# Patient Record
Sex: Female | Born: 1975 | Race: White | Hispanic: No | Marital: Married | Smoking: Never smoker
Health system: Southern US, Community
[De-identification: ages and names within clinical notes are randomized; demographics above are authoritative.]

## PROBLEM LIST (undated history)

## (undated) HISTORY — PX: NO PAST SURGERIES: SHX2092

---

## 2016-05-01 ENCOUNTER — Encounter: Payer: Self-pay | Admitting: Emergency Medicine

## 2016-05-01 ENCOUNTER — Emergency Department (INDEPENDENT_AMBULATORY_CARE_PROVIDER_SITE_OTHER): Payer: BLUE CROSS/BLUE SHIELD

## 2016-05-01 ENCOUNTER — Emergency Department (INDEPENDENT_AMBULATORY_CARE_PROVIDER_SITE_OTHER)
Admission: EM | Admit: 2016-05-01 | Discharge: 2016-05-01 | Disposition: A | Discharge status: Home / Self Care | Payer: BLUE CROSS/BLUE SHIELD | Source: Home / Self Care | Attending: Family Medicine | Admitting: Family Medicine

## 2016-05-01 DIAGNOSIS — R05 Cough: Secondary | ICD-10-CM | POA: Diagnosis not present

## 2016-05-01 DIAGNOSIS — J101 Influenza due to other identified influenza virus with other respiratory manifestations: Secondary | ICD-10-CM

## 2016-05-01 DIAGNOSIS — R079 Chest pain, unspecified: Secondary | ICD-10-CM

## 2016-05-01 DIAGNOSIS — R112 Nausea with vomiting, unspecified: Secondary | ICD-10-CM

## 2016-05-01 DIAGNOSIS — R0602 Shortness of breath: Secondary | ICD-10-CM | POA: Diagnosis not present

## 2016-05-01 MED ORDER — KETOROLAC TROMETHAMINE 30 MG/ML IJ SOLN
30.0000 mg | Freq: Once | INTRAMUSCULAR | Status: AC
  Administered 2016-05-01: 30 mg via INTRAMUSCULAR

## 2016-05-01 MED ORDER — ONDANSETRON 4 MG PO TBDP: ORAL_TABLET | ORAL | 0 refills | Status: DC

## 2016-05-01 MED ORDER — ONDANSETRON 4 MG PO TBDP: 4.0000 mg | ORAL_TABLET | Freq: Once | ORAL | Status: DC

## 2016-05-01 MED ORDER — GUAIFENESIN-CODEINE 100-10 MG/5ML PO SOLN: ORAL | 0 refills | Status: DC

## 2016-05-01 MED ORDER — OSELTAMIVIR PHOSPHATE 75 MG PO CAPS: 75.0000 mg | ORAL_CAPSULE | Freq: Two times a day (BID) | ORAL | 0 refills | Status: DC

## 2016-05-01 NOTE — ED Provider Notes (Signed)
[facility]    CSN: [id number] Arrival date & time: 05/01/16  1226     History   Chief Complaint Chief Complaint  Patient presents with  . Headache    HPI Lauren Pope is a 41 y.o. female.   Patient and [place] from [place] one week ago.  Two days ago she felt fatigued, and yesterday she developed some sinus congestion and scratchy throat.  Today she developed severe frontal headache, myalgias, chills, and non-productive cough.  No history of asthma. She reports that she took Ibuprofen four hours ago.      History reviewed. No pertinent past medical history.  There are no active problems to display for this patient.   History reviewed. No pertinent surgical history.  OB History    No data available       Home Medications    Prior to Admission medications   Medication Sig Start Date End Date Taking? Authorizing Provider  guaiFENesin-codeine 100-10 MG/5ML syrup Take 10mL by mouth at bedtime as needed for cough 05/01/16   Lattie HawStephen A Garon Melander, MD  ondansetron (ZOFRAN ODT) 4 MG disintegrating tablet Take one tab by mouth Q6hr prn nausea.  Dissolve under tongue. 05/01/16   Lattie HawStephen A Jaidan Prevette, MD  oseltamivir (TAMIFLU) 75 MG capsule Take 1 capsule (75 mg total) by mouth every 12 (twelve) hours. 05/01/16   Lattie HawStephen A Sheliah Fiorillo, MD    Family History No family history on file.  Social History Social History  Substance Use Topics  . Smoking status: Never Smoker  . Smokeless tobacco: Never Used  . Alcohol use Yes     Allergies   Patient has no known allergies.   Review of Systems Review of Systems + sore throat + cough No pleuritic pain, but feels tight in anterior chest. No wheezing + nasal congestion + post-nasal drainage No sinus pain/pressure No itchy/red eyes No earache No hemoptysis + SOB No fever, + chills/sweats + nausea + vomiting No abdominal pain No diarrhea No urinary symptoms No skin rash + fatigue + myalgias +  headache Used OTC meds without relief   Physical Exam Triage Vital Signs ED Triage Vitals [05/01/16 1331]  Enc Vitals Group     BP 107/70     Pulse Rate 99     Resp      Temp 98.3 F (36.8 C)     Temp Source Oral     SpO2 98 %     Weight 106 lb 8 oz (48.3 kg)     Height 5\' 5"  (1.651 m)     Head Circumference      Peak Flow      Pain Score      Pain Loc      Pain Edu?      Excl. in GC?    No data found.   Updated Vital Signs BP 107/70 (BP Location: Left Arm)   Pulse 99   Temp 98.3 F (36.8 C) (Oral)   Ht 5\' 5"  (1.651 m)   Wt 106 lb 8 oz (48.3 kg)   LMP 04/24/2016 (Approximate)   SpO2 98%   BMI 17.72 kg/m   Visual Acuity Right Eye Distance:   Left Eye Distance:   Bilateral Distance:    Right Eye Near:   Left Eye Near:    Bilateral Near:     Physical Exam Nursing notes and Vital Signs reviewed. Appearance:  Patient appears stated age.  She appears uncomfortable but in no acute distress Eyes:  Pupils are equal, round, and reactive to light and accomodation.  Extraocular movement is intact.  Conjunctivae are not inflamed  Ears:  Canals normal.  Tympanic membranes normal.  Nose:  Congested turbinates.  No sinus tenderness.    Pharynx:  Normal Neck:  Supple.  Non-tender mildly enlarged posterior/lateral nodes are palpated bilaterally  Lungs:  Clear to auscultation.  Breath sounds are equal.  Moving air well. Heart:  Regular rate and rhythm without murmurs, rubs, or gallops.  Abdomen:  Nontender without masses or hepatosplenomegaly.  Bowel sounds are present.  No CVA or flank tenderness.  Extremities:  No edema.  Skin:  No rash present.    UC Treatments / Results  Labs (all labs ordered are listed, but only abnormal results are displayed) Labs Reviewed  POCT INFLUENZA A/B:  Positive B       EKG  EKG Interpretation None       Radiology Dg Chest 2 View  Result Date: 05/01/2016 CLINICAL DATA:  Cough and shortness of breath. Central chest pain.  Myalgias. Emesis. Headache. EXAM: CHEST  2 VIEW COMPARISON:  None. FINDINGS: The heart size and mediastinal contours are within normal limits. Both lungs are clear. The visualized skeletal structures are unremarkable. IMPRESSION: Normal examination. Electronically Signed   By: Beckie Salts M.D.   On: 05/01/2016 14:13    Procedures Procedures (including critical care time)  Medications Ordered in UC Medications  ondansetron (ZOFRAN-ODT) disintegrating tablet 4 mg (not administered)  ketorolac (TORADOL) 30 MG/ML injection 30 mg (30 mg Intramuscular Given 05/01/16 1404)     Initial Impression / Assessment and Plan / UC Course  I have reviewed the triage vital signs and the nursing notes.  Pertinent labs & imaging results that were available during my care of the patient were reviewed by me and considered in my medical decision making (see chart for details).    Administered Zofran ODT 4mg  PO.  Given RX for same.  Administered Toradol 30mg  IM Begin Tamiflu. Rx for Robitussin AC for night time cough.   Take plain guaifenesin (1200mg  extended release tabs such as Mucinex) twice daily, with plenty of water, for cough and congestion.  May add Pseudoephedrine (30mg , one or two every 4 to 6 hours) for sinus congestion.  Get adequate rest.   Try warm salt water gargles for sore throat.  Stop all antihistamines for now, and other non-prescription cough/cold preparations. May take Ibuprofen 200mg , 4 tabs every 8 hours with food for body aches, headache, etc. Rx prophylactic Tamiflu for three family memmers. Followup with Family Doctor if not improved in five days.    Final Clinical Impressions(s) / UC Diagnoses   Final diagnoses:  Influenza B  Nausea and vomiting in adult    New Prescriptions New Prescriptions   GUAIFENESIN-CODEINE 100-10 MG/5ML SYRUP    Take 10mL by mouth at bedtime as needed for cough   ONDANSETRON (ZOFRAN ODT) 4 MG DISINTEGRATING TABLET    Take one tab by mouth Q6hr prn  nausea.  Dissolve under tongue.   OSELTAMIVIR (TAMIFLU) 75 MG CAPSULE    Take 1 capsule (75 mg total) by mouth every 12 (twelve) hours.     Lattie Haw, MD 05/01/16 [phone]

## 2016-05-01 NOTE — ED Triage Notes (Signed)
Pt c/o a massive HA today, no relief w/Tylenol, vomitting, croupy cough, trouble breathing, body aches.

## 2016-05-01 NOTE — Discharge Instructions (Signed)
Take plain guaifenesin (1200mg extended release tabs such as Mucinex) twice daily, with plenty of water, for cough and congestion.  May add Pseudoephedrine (30mg, one or two every 4 to 6 hours) for sinus congestion.  Get adequate rest.   °Try warm salt water gargles for sore throat.  °Stop all antihistamines for now, and other non-prescription cough/cold preparations. °May take Ibuprofen 200mg, 4 tabs every 8 hours with food for body aches, headache, etc. °  °

## 2016-05-02 ENCOUNTER — Telehealth: Payer: Self-pay | Admitting: Emergency Medicine

## 2016-05-02 LAB — POC INFLUENZA A&B (BINAX/QUICKVUE)
INFLUENZA A, POC: NEGATIVE
Influenza B, POC: POSITIVE — AB

## 2016-05-02 NOTE — Telephone Encounter (Signed)
Encounter created to add order.

## 2016-05-03 ENCOUNTER — Telehealth: Payer: Self-pay

## 2016-05-03 NOTE — Telephone Encounter (Signed)
Left message for patient to call UC or PCP if questions or concerns,

## 2017-10-20 IMAGING — DX DG CHEST 2V
2 series · 2 of 2 positions shown · non-contrast
Comparison: None.

CLINICAL DATA: Cough and shortness of breath. Central chest pain.
Myalgias. Emesis. Headache.

EXAM:
CHEST  2 VIEW

[chest pa]
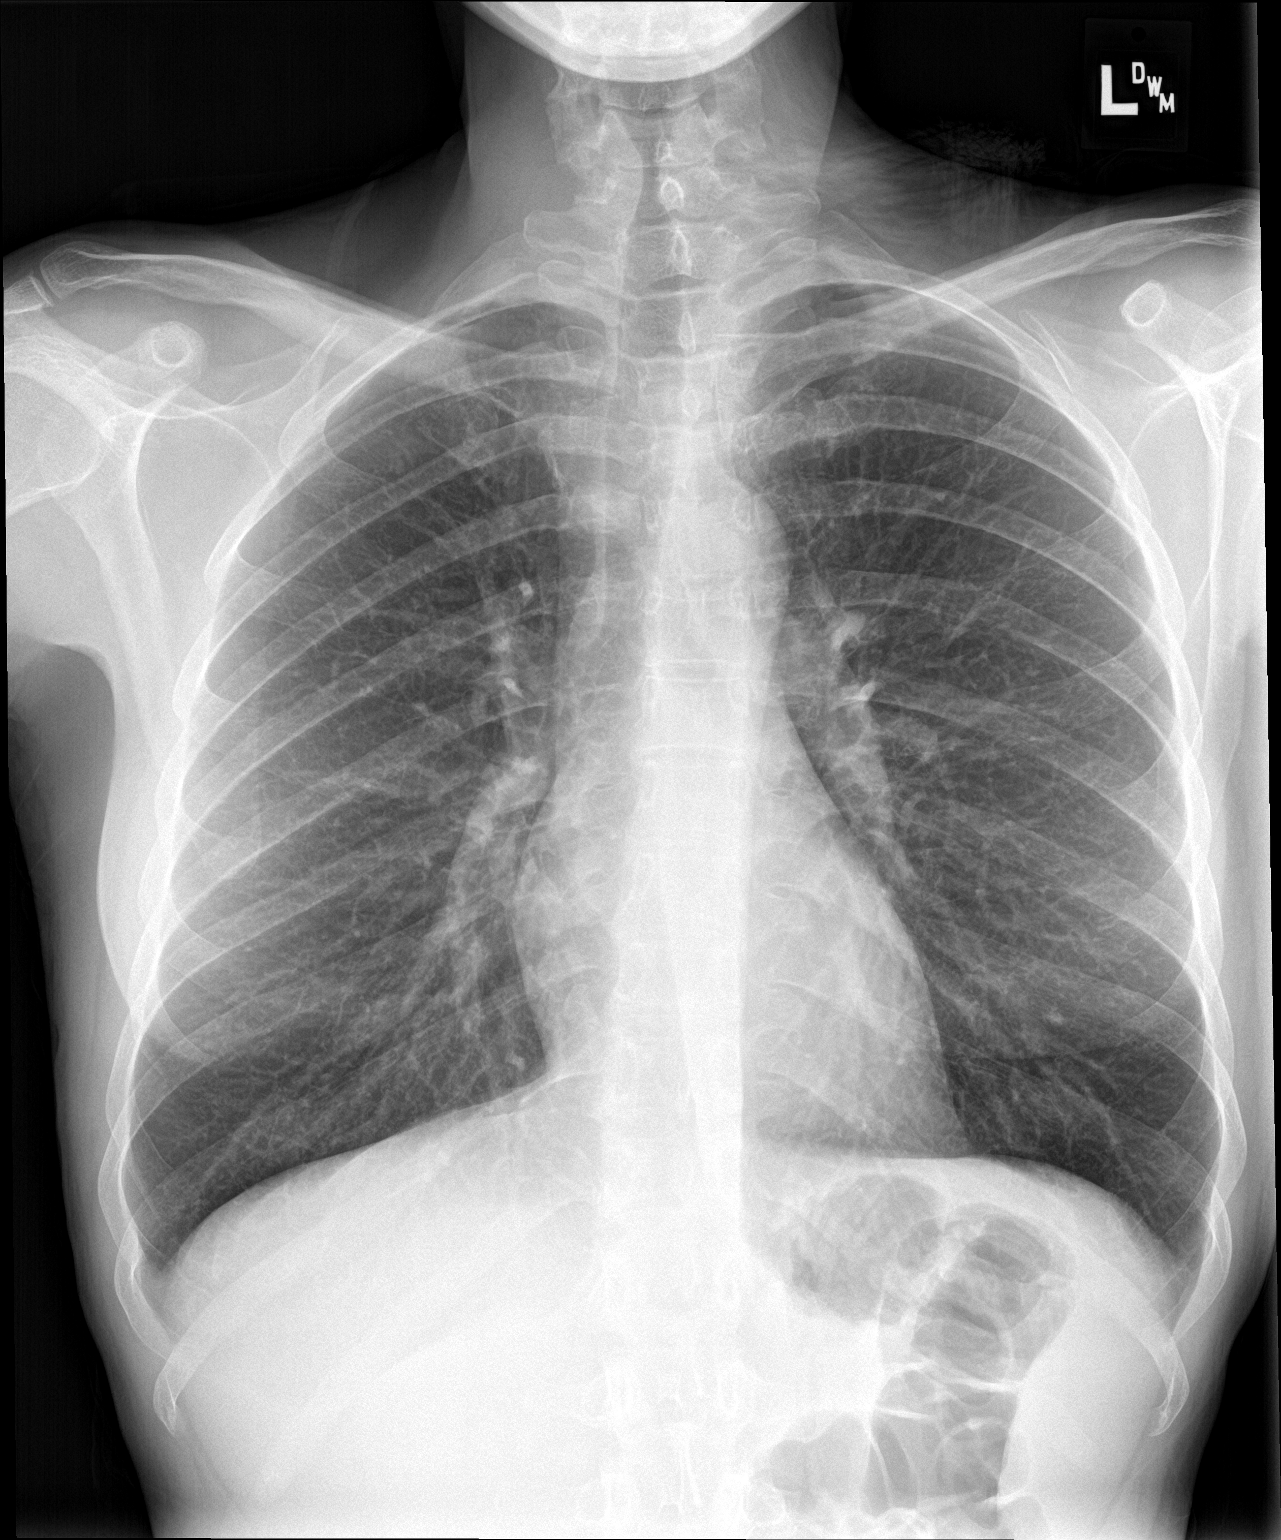

[chest lat]
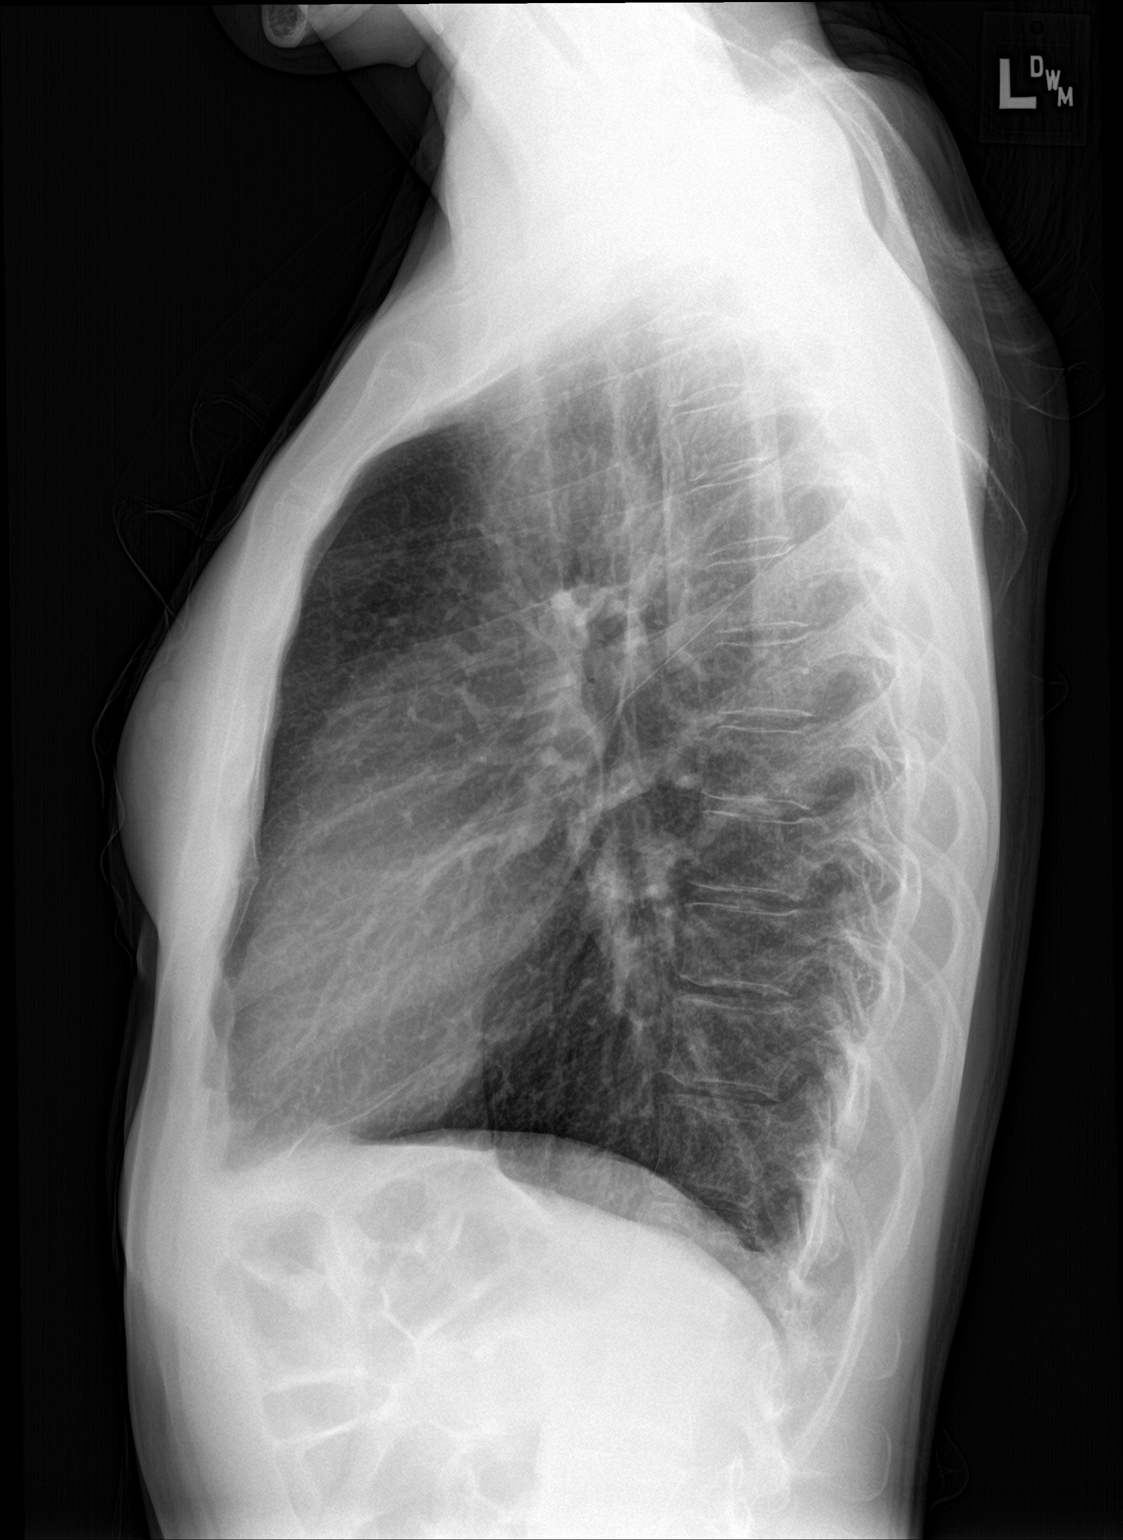

[2 of 2 positions shown; findings below may reference images not displayed]

FINDINGS: The heart size and mediastinal contours are within normal limits.
Both lungs are clear. The visualized skeletal structures are
unremarkable.
IMPRESSION: Normal examination.

## 2019-11-20 ENCOUNTER — Other Ambulatory Visit: Payer: Self-pay | Admitting: Allergy and Immunology

## 2019-11-20 ENCOUNTER — Telehealth: Payer: Self-pay

## 2019-11-20 ENCOUNTER — Other Ambulatory Visit: Payer: Self-pay

## 2019-11-20 ENCOUNTER — Encounter: Payer: Self-pay | Admitting: Allergy and Immunology

## 2019-11-20 ENCOUNTER — Ambulatory Visit (INDEPENDENT_AMBULATORY_CARE_PROVIDER_SITE_OTHER): Payer: 59 | Admitting: Allergy and Immunology

## 2019-11-20 VITALS — BP 100/60 | HR 76 | Temp 99.0°F | Resp 14 | Ht 64.0 in | Wt 106.6 lb

## 2019-11-20 DIAGNOSIS — H01136 Eczematous dermatitis of left eye, unspecified eyelid: Secondary | ICD-10-CM | POA: Diagnosis not present

## 2019-11-20 DIAGNOSIS — J3089 Other allergic rhinitis: Secondary | ICD-10-CM | POA: Diagnosis not present

## 2019-11-20 DIAGNOSIS — L718 Other rosacea: Secondary | ICD-10-CM | POA: Diagnosis not present

## 2019-11-20 DIAGNOSIS — H1010 Acute atopic conjunctivitis, unspecified eye: Secondary | ICD-10-CM | POA: Insufficient documentation

## 2019-11-20 HISTORY — DX: Eczematous dermatitis of left eye, unspecified eyelid: H01.136

## 2019-11-20 MED ORDER — OLOPATADINE HCL 0.2 % OP SOLN: 1.0000 [drp] | Freq: Every day | OPHTHALMIC | 5 refills | Status: AC | PRN

## 2019-11-20 MED ORDER — PIMECROLIMUS 1 % EX CREA: TOPICAL_CREAM | CUTANEOUS | 3 refills | Status: AC

## 2019-11-20 NOTE — Assessment & Plan Note (Signed)
Environmental allergen skin testing revealed reactivity to grass pollen.  However, the patient's symptoms persist year-round.  Food allergen skin tests were negative today despite a positive histamine control.  The negative predictive value for food allergen skin testing is excellent.  Moisturize at night with Aquaphor.  A prescription has been provided for Elidel 1% cream twice a day to affected areas as needed.  Assess for symptom improvement over the next 6 weeks.  If no improvement, discontinue.  If this problem persists or progresses, consider rheumatology evaluation per Dr. Zenovia Jordan to rule out possible autoimmune processes.

## 2019-11-20 NOTE — Telephone Encounter (Signed)
Pa submitted and approved thru cover my meds for elidel

## 2019-11-20 NOTE — Progress Notes (Signed)
New Patient Note  RE: Lauren Pope MRN: [id number] DOB: [birth year] Date of Office Visit: 11/20/2019  Referring provider: Loyal Jacobson, MD Primary care provider: Loyal Jacobson, MD  Chief Complaint: Eczema   History of present illness: Lauren Pope is a 44 y.o. female seen today in consultation requested by Loyal Jacobson, MD.  She complains of dermatitis around her left eye since March 2020.  She reports that the dermatitis flares with the consumption of beer and/or wine.  On occasion, the dermatitis will flare with the consumption of pretzels as well.  On other occasions, the dermatitis seems to flare when she is exposed to grass or tree pollen.  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  She is currently using Protopic and taking doxycycline 20 mg twice daily.  The dermatitis has persisted despite these treatments.  She has tried [place] in the past which caused a "bad reaction." Lauren Pope experiences nasal congestion, rhinorrhea, sneezing, postnasal drainage, nasal pruritus, and ocular pruritus.  The symptoms are most frequent and severe with pollen and/or dust exposure.  He has taken antihistamines, including Allegra, in the past but complains of drowsiness with oral antihistamines.  Assessment and plan: Atopic dermatitis of eyelid, left Environmental allergen skin testing revealed reactivity to grass pollen.  However, the patient's symptoms persist year-round.  Food allergen skin tests were negative today despite a positive histamine control.  The negative predictive value for food allergen skin testing is excellent.  Moisturize at night with Aquaphor.  A prescription has been provided for Elidel 1% cream twice a day to affected areas as needed.  Assess for symptom improvement over the next 6 weeks.  If no improvement, discontinue.  If this problem persists or progresses, consider rheumatology  evaluation per Dr. Zenovia Jordan to rule out possible autoimmune processes.  Ocular rosacea  Continue doxycycline as prescribed and other recommendations per dermatologist.  Follow-up with dermatologist as recommended.  Seasonal allergic rhinitis  Aeroallergen avoidance measures have been discussed and provided in written form.  Continue fluticasone nasal spray, 1 to 2 sprays per nostril daily if needed.  Nasal saline spray (i.e. Simply Saline) is recommended prior to medicated nasal sprays and as needed.  Seasonal allergic conjunctivitis  Treatment plan as outlined above for allergic rhinitis.  A prescription has been provided for generic Pataday, one drop per eye daily as needed.  If insurance does not cover this medication, medicated allergy eyedrops may be purchased over-the-counter as Art therapist.  I have also recommended eye lubricant drops (i.e., Natural Tears) as needed.   Meds ordered this encounter  Medications  . pimecrolimus (ELIDEL) 1 % cream    Sig: Apply twice a day to affected areas as needed    Dispense:  30 g    Refill:  3    Dispense generic  . Olopatadine HCl (PATADAY) 0.2 % SOLN    Sig: Place 1 drop into both eyes daily as needed.    Dispense:  2.5 mL    Refill:  5    Diagnostics: Environmental skin testing: Reactive to grass pollen. Food allergen skin testing: Negative despite a positive histamine control.    Physical examination: Blood pressure 100/60, pulse 76, temperature 99 F (37.2 C), temperature source Tympanic, resp. rate 14, height 5\' 4"  (1.626 m), weight 106 lb 9.6 oz (48.4 kg).  General: Alert, interactive, in no acute distress. HEENT: TMs pearly gray, turbinates minimally edematous without  discharge, post-pharynx unremarkable. Neck: Supple without lymphadenopathy. Lungs: Clear to auscultation without wheezing, rhonchi or rales. CV: Normal S1, S2 without murmurs. Abdomen: Nondistended, nontender. Skin: Left  upper eyelid erythematous with several small papules. Extremities:  No clubbing, cyanosis or edema. Neuro:   Grossly intact.  Review of systems:  Review of systems negative except as noted in HPI / PMHx or noted below: Review of Systems  Constitutional: Negative.   HENT: Negative.   Eyes: Negative.   Respiratory: Negative.   Cardiovascular: Negative.   Gastrointestinal: Negative.   Genitourinary: Negative.   Musculoskeletal: Negative.   Skin: Negative.   Neurological: Negative.   Endo/Heme/Allergies: Negative.   Psychiatric/Behavioral: Negative.     Past medical history:  Past Medical History:  Diagnosis Date  . Atopic dermatitis of eyelid, left 11/20/2019    Past surgical history:  Past Surgical History:  Procedure Laterality Date  . NO PAST SURGERIES      Family history: Family History  Problem Relation Age of Onset  . Allergic rhinitis Mother   . Allergic rhinitis Father   . COPD Father   . Asthma Son   . Eczema Niece   . Angioedema Neg Hx   . Immunodeficiency Neg Hx   . Urticaria Neg Hx     Social history: Social History   Socioeconomic History  . Marital status: Married    Spouse name: Not on file  . Number of children: Not on file  . Years of education: Not on file  . Highest education level: Not on file  Occupational History  . Not on file  Tobacco Use  . Smoking status: Never Smoker  . Smokeless tobacco: Never Used  Vaping Use  . Vaping Use: Never used  Substance and Sexual Activity  . Alcohol use: Yes  . Drug use: Never  . Sexual activity: Not on file  Other Topics Concern  . Not on file  Social History Narrative  . Not on file   Social Determinants of Health   Financial Resource Strain:   . Difficulty of Paying Living Expenses: Not on file  Food Insecurity:   . Worried About Programme researcher, broadcasting/film/video in the Last Year: Not on file  . Ran Out of Food in the Last Year: Not on file  Transportation Needs:   . Lack of Transportation  (Medical): Not on file  . Lack of Transportation (Non-Medical): Not on file  Physical Activity:   . Days of Exercise per Week: Not on file  . Minutes of Exercise per Session: Not on file  Stress:   . Feeling of Stress : Not on file  Social Connections:   . Frequency of Communication with Friends and Family: Not on file  . Frequency of Social Gatherings with Friends and Family: Not on file  . Attends Religious Services: Not on file  . Active Member of Clubs or Organizations: Not on file  . Attends Banker Meetings: Not on file  . Marital Status: Not on file  Intimate Partner Violence:   . Fear of Current or Ex-Partner: Not on file  . Emotionally Abused: Not on file  . Physically Abused: Not on file  . Sexually Abused: Not on file    Environmental History: Patient lives in a 41-year-old house with carpeting the bedroom, gassy, and central air.  There are no known mold/water damage issues in the home.  There are no pets in the home.  She is a non-smoker.  Current Outpatient Medications  Medication  Sig Dispense Refill  . doxycycline (PERIOSTAT) 20 MG tablet Take 20 mg by mouth 2 (two) times daily.    . fluticasone (FLONASE) 50 MCG/ACT nasal spray Place 1 spray into both nostrils daily.    . Multiple Vitamins-Minerals (MULTIVITAMIN ADULT) CHEW Chew by mouth.    . tacrolimus (PROTOPIC) 0.1 % ointment Apply 1 application topically 2 (two) times daily as needed.    . Olopatadine HCl (PATADAY) 0.2 % SOLN Place 1 drop into both eyes daily as needed. 2.5 mL 5  . pimecrolimus (ELIDEL) 1 % cream Apply twice a day to affected areas as needed 30 g 3   No current facility-administered medications for this visit.    Known medication allergies: No Known Allergies  I appreciate the opportunity to take part in Railynn's care. Please do not hesitate to contact me with questions.  Sincerely,   R. Jorene Guest, MD

## 2019-11-20 NOTE — Assessment & Plan Note (Signed)
   Continue doxycycline as prescribed and other recommendations per dermatologist.  Follow-up with dermatologist as recommended. 

## 2019-11-20 NOTE — Assessment & Plan Note (Signed)
   Aeroallergen avoidance measures have been discussed and provided in written form.  Continue fluticasone nasal spray, 1 to 2 sprays per nostril daily if needed.  Nasal saline spray (i.e. Simply Saline) is recommended prior to medicated nasal sprays and as needed.

## 2019-11-20 NOTE — Assessment & Plan Note (Signed)
   Treatment plan as outlined above for allergic rhinitis.  A prescription has been provided for generic Pataday, one drop per eye daily as needed.  If insurance does not cover this medication, medicated allergy eyedrops may be purchased over-the-counter as Pataday Extra Strength or Zaditor.  I have also recommended eye lubricant drops (i.e., Natural Tears) as needed. 

## 2019-11-20 NOTE — Patient Instructions (Addendum)
Atopic dermatitis of eyelid, left Environmental allergen skin testing revealed reactivity to grass pollen.  However, the patient's symptoms persist year-round.  Food allergen skin tests were negative today despite a positive histamine control.  The negative predictive value for food allergen skin testing is excellent.  Moisturize at night with Aquaphor.  A prescription has been provided for Elidel 1% cream twice a day to affected areas as needed.  Assess for symptom improvement over the next 6 weeks.  If no improvement, discontinue.  If this problem persists or progresses, consider rheumatology evaluation per Dr. Zenovia Jordan to rule out possible autoimmune processes.  Ocular rosacea  Continue doxycycline as prescribed and other recommendations per dermatologist.  Follow-up with dermatologist as recommended.  Seasonal allergic rhinitis  Aeroallergen avoidance measures have been discussed and provided in written form.  Continue fluticasone nasal spray, 1 to 2 sprays per nostril daily if needed.  Nasal saline spray (i.e. Simply Saline) is recommended prior to medicated nasal sprays and as needed.  Seasonal allergic conjunctivitis  Treatment plan as outlined above for allergic rhinitis.  A prescription has been provided for generic Pataday, one drop per eye daily as needed.  If insurance does not cover this medication, medicated allergy eyedrops may be purchased over-the-counter as Art therapist.  I have also recommended eye lubricant drops (i.e., Natural Tears) as needed.   Follow-up if needed  Reducing Pollen Exposure    1. Use nasal saline spray (i.e., Simply Saline) as needed. 2. Use eye lubricant drops (i.e., Natural Tears) as needed. 3. Do not hang sheets or clothing out to dry; pollen may collect on these items. 4. Do not mow lawns or spend time around freshly cut grass; mowing stirs up pollen. 5. Keep windows closed at night.  Keep car windows closed  while driving. 6. Minimize morning activities outdoors, a time when pollen counts are usually at their highest. 7. Stay indoors as much as possible when pollen counts or humidity is high and on windy days when pollen tends to remain in the air longer. 8. Use air conditioning when possible.  Many air conditioners have filters that trap the pollen spores. 9. Use a HEPA room air filter to remove pollen form the indoor air you breathe.
